# Patient Record
Sex: Female | Born: 1960 | Race: Black or African American | Hispanic: No | Marital: Married | Smoking: Never smoker
Health system: Southern US, Community
[De-identification: ages and names within clinical notes are randomized; demographics above are authoritative.]

## PROBLEM LIST (undated history)

## (undated) DIAGNOSIS — I10 Essential (primary) hypertension: Secondary | ICD-10-CM

## (undated) HISTORY — PX: ROTATOR CUFF REPAIR: SHX139

## (undated) HISTORY — DX: Essential (primary) hypertension: I10

## (undated) HISTORY — PX: APPENDECTOMY: SHX54

---

## 2000-06-12 ENCOUNTER — Other Ambulatory Visit: Admission: RE | Admit: 2000-06-12 | Discharge: 2000-06-12 | Payer: Self-pay | Admitting: Family Medicine

## 2000-06-14 ENCOUNTER — Encounter: Payer: Self-pay | Admitting: Family Medicine

## 2000-06-14 ENCOUNTER — Ambulatory Visit (HOSPITAL_COMMUNITY): Admission: RE | Admit: 2000-06-14 | Discharge: 2000-06-14 | Payer: Self-pay | Admitting: Family Medicine

## 2009-10-01 ENCOUNTER — Encounter: Admission: RE | Admit: 2009-10-01 | Discharge: 2009-10-01 | Payer: Self-pay | Admitting: Obstetrics and Gynecology

## 2009-12-22 ENCOUNTER — Encounter (INDEPENDENT_AMBULATORY_CARE_PROVIDER_SITE_OTHER): Payer: Self-pay

## 2009-12-23 ENCOUNTER — Ambulatory Visit: Payer: Self-pay | Admitting: Gastroenterology

## 2010-08-28 ENCOUNTER — Emergency Department (HOSPITAL_COMMUNITY): Admission: EM | Admit: 2010-08-28 | Discharge: 2010-08-28 | Payer: Self-pay | Admitting: Emergency Medicine

## 2011-01-07 ENCOUNTER — Other Ambulatory Visit: Payer: Self-pay | Admitting: Obstetrics and Gynecology

## 2011-01-09 ENCOUNTER — Encounter: Payer: Self-pay | Admitting: Obstetrics and Gynecology

## 2011-01-18 NOTE — Letter (Signed)
Summary: [facility] Instructions  [facility]  [address] [place], [place] [address]   Phone: [phone]  Fax: [phone]       Kathryn Mullen    1961/11/30    MRN: [id number]        Procedure Day /Date:_1/19/11  Wednesday     Arrival Time:_9:00am      Procedure Time:_10:00am     Location of Procedure:                    _x _  [facility] (4th Floor)                        PREPARATION FOR COLONOSCOPY WITH MOVIPREP   Starting 5 days prior to your procedure _1/14/11  do not eat nuts, seeds, popcorn, corn, beans, peas,  salads, or any raw vegetables.  Do not take any fiber supplements (e.g. Metamucil, Citrucel, and Benefiber).  THE DAY BEFORE YOUR PROCEDURE         DATE: 01/05/10  DAY: Tuesday  1.  Drink clear liquids the entire day-NO SOLID FOOD  2.  Do not drink anything colored red or purple.  Avoid juices with pulp.  No orange juice.  3.  Drink at least 64 oz. (8 glasses) of fluid/clear liquids during the day to prevent dehydration and help the prep work efficiently.  CLEAR LIQUIDS INCLUDE: Water Jello Ice Popsicles Tea (sugar ok, no milk/cream) Powdered fruit flavored drinks Coffee (sugar ok, no milk/cream) Gatorade Juice: apple, white grape, white cranberry  Lemonade Clear bullion, consomm, broth Carbonated beverages (any kind) Strained chicken noodle soup Hard Candy                             4.  In the morning, mix first dose of MoviPrep solution:    Empty 1 Pouch A and 1 Pouch B into the disposable container    Add lukewarm drinking water to the top line of the container. Mix to dissolve    Refrigerate (mixed solution should be used within 24 hrs)  5.  Begin drinking the prep at 5:00 p.m. The MoviPrep container is divided by 4 marks.   Every 15 minutes drink the solution down to the next mark (approximately 8 oz) until the full liter is complete.   6.  Follow completed prep with 16 oz of clear liquid of your choice (Nothing  red or purple).  Continue to drink clear liquids until bedtime.  7.  Before going to bed, mix second dose of MoviPrep solution:    Empty 1 Pouch A and 1 Pouch B into the disposable container    Add lukewarm drinking water to the top line of the container. Mix to dissolve    Refrigerate  THE DAY OF YOUR PROCEDURE      DATE: 01/06/10 DAY: Wednesday  Beginning at 5:00a.m. (5 hours before procedure):         1. Every 15 minutes, drink the solution down to the next mark (approx 8 oz) until the full liter is complete.  2. Follow completed prep with 16 oz. of clear liquid of your choice.    3. You may drink clear liquids until 8:00am (2 HOURS BEFORE PROCEDURE).   MEDICATION INSTRUCTIONS  Unless otherwise instructed, you should take regular prescription medications with a small sip of water   as early as possible the morning of your procedure.  Additional medication instructions: _Do not take fluid pill am of procedure.         OTHER INSTRUCTIONS  You will need a responsible adult at least 52 years of age to accompany you and drive you home.   This person must remain in the waiting room during your procedure.  Wear loose fitting clothing that is easily removed.  Leave jewelry and other valuables at home.  However, you may wish to bring a book to read or  an iPod/MP3 player to listen to music as you wait for your procedure to start.  Remove all body piercing jewelry and leave at home.  Total time from sign-in until discharge is approximately 2-3 hours.  You should go home directly after your procedure and rest.  You can resume normal activities the  day after your procedure.  The day of your procedure you should not:   Drive   Make legal decisions   Operate machinery   Drink alcohol   Return to work  You will receive specific instructions about eating, activities and medications before you leave.    The above instructions have been reviewed and explained to  me by   Ulis Rias RN  December 23, 2009 1:15 PM     I fully understand and can verbalize these instructions _____________________________ Date _________

## 2011-01-18 NOTE — Miscellaneous (Signed)
Summary: Lec previsit  Clinical Lists Changes  Medications: Added new medication of MOVIPREP 100 GM  SOLR (PEG-KCL-NACL-NASULF-NA ASC-C) As per prep instructions. - Signed Rx of MOVIPREP 100 GM  SOLR (PEG-KCL-NACL-NASULF-NA ASC-C) As per prep instructions.;  #1 x 0;  Signed;  Entered by: Ulis Rias RN;  Authorized by: Mardella Layman MD [facility];  Method used: Electronically to [facility]  [address], [place], [place]  [address], Ph: [phone] or [phone], Fax: [phone] Observations: Added new observation of NKA: T (12/23/2009 12:46)    Prescriptions: MOVIPREP 100 GM  SOLR (PEG-KCL-NACL-NASULF-NA ASC-C) As per prep instructions.  #1 x 0   Entered by:   Ulis Rias RN   Authorized by:   Mardella Layman MD [facility]   Signed by:   Ulis Rias RN on 12/23/2009   Method used:   [facility]ronically to        [facility]  [address] (retail)       [address]       [place], [place]  [address]       Ph: [phone] or [phone]       Fax: [phone]   RxID:   [id number]

## 2012-03-06 ENCOUNTER — Encounter: Payer: Self-pay | Admitting: Gastroenterology

## 2012-04-16 ENCOUNTER — Encounter: Payer: Self-pay | Admitting: Gastroenterology

## 2013-01-08 ENCOUNTER — Encounter: Payer: Self-pay | Admitting: Internal Medicine

## 2013-01-22 ENCOUNTER — Ambulatory Visit (AMBULATORY_SURGERY_CENTER): Payer: BC Managed Care – PPO

## 2013-01-22 VITALS — Ht 63.0 in | Wt 164.6 lb

## 2013-01-22 DIAGNOSIS — Z1211 Encounter for screening for malignant neoplasm of colon: Secondary | ICD-10-CM

## 2013-01-22 MED ORDER — MOVIPREP 100 G PO SOLR: 1.0000 | Freq: Once | ORAL | Status: DC

## 2013-01-22 NOTE — Progress Notes (Signed)
Pt came into the office today for her pre-visit prior to her colonoscopy with Dr Juanda Chance on 02/05/13. Pt states she had a colononscopy done 25 years ago in [place] due to rectal bleeding, but does not have the results and does not remember the name of the doctor.

## 2013-02-05 ENCOUNTER — Ambulatory Visit (AMBULATORY_SURGERY_CENTER): Payer: BC Managed Care – PPO | Admitting: Internal Medicine

## 2013-02-05 ENCOUNTER — Encounter: Payer: Self-pay | Admitting: Internal Medicine

## 2013-02-05 VITALS — BP 129/77 | HR 84 | Temp 96.6°F | Resp 16 | Ht 63.0 in | Wt 164.0 lb

## 2013-02-05 DIAGNOSIS — Z1211 Encounter for screening for malignant neoplasm of colon: Secondary | ICD-10-CM

## 2013-02-05 MED ORDER — SODIUM CHLORIDE 0.9 % IV SOLN: 500.0000 mL | INTRAVENOUS | Status: DC

## 2013-02-05 NOTE — Patient Instructions (Signed)
YOU HAD AN ENDOSCOPIC PROCEDURE TODAY AT THE [facility]: Refer to the procedure report that was given to you for any specific questions about what was found during the examination.  If the procedure report does not answer your questions, please call your gastroenterologist to clarify.  If you requested that your care partner not be given the details of your procedure findings, then the procedure report has been included in a sealed envelope for you to review at your convenience later.  YOU SHOULD EXPECT: Some feelings of bloating in the abdomen. Passage of more gas than usual.  Walking can help get rid of the air that was put into your GI tract during the procedure and reduce the bloating. If you had a lower endoscopy (such as a colonoscopy or flexible sigmoidoscopy) you may notice spotting of blood in your stool or on the toilet paper. If you underwent a bowel prep for your procedure, then you may not have a normal bowel movement for a few days.  DIET: Your first meal following the procedure should be a light meal and then it is ok to progress to your normal diet.  A half-sandwich or bowl of soup is an example of a good first meal.  Heavy or fried foods are harder to digest and may make you feel nauseous or bloated.  Likewise meals heavy in dairy and vegetables can cause extra gas to form and this can also increase the bloating.  Drink plenty of fluids but you should avoid alcoholic beverages for 24 hours.  ACTIVITY: Your care partner should take you home directly after the procedure.  You should plan to take it easy, moving slowly for the rest of the day.  You can resume normal activity the day after the procedure however you should NOT DRIVE or use heavy machinery for 24 hours (because of the sedation medicines used during the test).    SYMPTOMS TO REPORT IMMEDIATELY: A gastroenterologist can be reached at any hour.  During normal business hours, 8:30 AM to 5:00 PM Monday through Friday,  call [phone].  After hours and on weekends, please call the GI answering service at [phone] who will take a message and have the physician on call contact you.   Following lower endoscopy (colonoscopy or flexible sigmoidoscopy):  Excessive amounts of blood in the stool  Significant tenderness or worsening of abdominal pains  Swelling of the abdomen that is new, acute  Fever of 100F or higher    FOLLOW UP: If any biopsies were taken you will be contacted by phone or by letter within the next 1-3 weeks.  Call your gastroenterologist if you have not heard about the biopsies in 3 weeks.  Our staff will call the home number listed on your records the next business day following your procedure to check on you and address any questions or concerns that you may have at that time regarding the information given to you following your procedure. This is a courtesy call and so if there is no answer at the home number and we have not heard from you through the emergency physician on call, we will assume that you have returned to your regular daily activities without incident.  SIGNATURES/CONFIDENTIALITY: You and/or your care partner have signed paperwork which will be entered into your electronic medical record.  These signatures attest to the fact that that the information above on your After Visit Summary has been reviewed and is understood.  Full responsibility of the confidentiality   of this discharge information lies with you and/or your care-partner.     

## 2013-02-05 NOTE — Progress Notes (Signed)
Patient did not experience any of the following events: a burn prior to discharge; a fall within the facility; wrong site/side/patient/procedure/implant event; or a hospital transfer or hospital admission upon discharge from the facility. (G8907) Patient did not have preoperative order for IV antibiotic SSI prophylaxis. (G8918)  

## 2013-02-05 NOTE — Op Note (Signed)
[facility] [address] [place], [address]   COLONOSCOPY PROCEDURE REPORT  PATIENT: Kathryn Mullen, Kathryn Mullen  MR#: [id number] BIRTHDATE: 06-19-1961 , 51  yrs. old GENDER: Female ENDOSCOPIST: Hart Carwin, MD REFERRED BY:  Carrington Clamp, M.D. PROCEDURE DATE:  02/05/2013 PROCEDURE:   Colonoscopy, screening ASA CLASS:   Class I INDICATIONS:Average risk patient for colon cancer and prior colonoscopy in [place] 20 years ago. MEDICATIONS: MAC sedation, administered by CRNA and propofol (Diprivan) 200mg  IV  DESCRIPTION OF PROCEDURE:   After the risks and benefits and of the procedure were explained, informed consent was obtained.  A digital rectal exam revealed no abnormalities of the rectum.    The LB [id number]-[id number]  endoscope was introduced through the anus and advanced to the cecum, which was identified by both the appendix and ileocecal valve .  The quality of the prep was excellent, using MoviPrep .  The instrument was then slowly withdrawn as the colon was fully examined.     COLON FINDINGS: A normal appearing cecum, ileocecal valve, and appendiceal orifice were identified.  The ascending, hepatic flexure, transverse, splenic flexure, descending, sigmoid colon and rectum appeared unremarkable.  No polyps or cancers were seen. Retroflexed views revealed no abnormalities.     The scope was then withdrawn from the patient and the procedure completed.  COMPLICATIONS: There were no complications. ENDOSCOPIC IMPRESSION: Normal colon  RECOMMENDATIONS: High fiber diet   REPEAT EXAM: In 10 year(s)  for Colonoscopy.  cc:  _______________________________ eSignedHart Carwin, MD 02/05/2013 11:40 AM

## 2013-02-05 NOTE — Progress Notes (Signed)
Patient did not have preoperative order for IV antibiotic SSI prophylaxis. (G8918)  Patient did not experience any of the following events: a burn prior to discharge; a fall within the facility; wrong site/side/patient/procedure/implant event; or a hospital transfer or hospital admission upon discharge from the facility. (G8907)  

## 2013-02-06 ENCOUNTER — Telehealth: Payer: Self-pay | Admitting: *Deleted

## 2013-02-06 NOTE — Telephone Encounter (Signed)
  Follow up Call-  Call back number 02/05/2013  Post procedure Call Back phone  # [phone]  Permission to leave phone message Yes     Patient questions:  Do you have a fever, pain , or abdominal swelling? no Pain Score  0 *  Have you tolerated food without any problems? yes  Have you been able to return to your normal activities? yes  Do you have any questions about your discharge instructions: Diet   no Medications  no Follow up visit  no  Do you have questions or concerns about your Care? no  Actions: * If pain score is 4 or above: No action needed, pain <4.

## 2020-02-27 ENCOUNTER — Ambulatory Visit: Payer: BC Managed Care – PPO | Attending: Family

## 2020-02-27 DIAGNOSIS — Z23 Encounter for immunization: Secondary | ICD-10-CM

## 2020-02-27 NOTE — Progress Notes (Signed)
   Covid-19 Vaccination Clinic  Name:  Kathryn Mullen    MRN: [id number] DOB: February 18, 1961  02/27/2020  [name] was observed post Covid-19 immunization for 15 minutes without incident. She was provided with Vaccine Information Sheet and instruction to access the V-Safe system.   [name] was instructed to call 911 with any severe reactions post vaccine: Marland Kitchen Difficulty breathing  . Swelling of face and throat  . A fast heartbeat  . A bad rash all over body  . Dizziness and weakness   Immunizations Administered    Name Date Dose VIS Date Route   Moderna COVID-19 Vaccine 02/27/2020 11:07 AM 0.5 mL 11/19/2019 Intramuscular   Manufacturer: Moderna   Lot: [id number]   NDC: 92493-241-99

## 2020-03-25 ENCOUNTER — Other Ambulatory Visit: Payer: Self-pay | Admitting: Obstetrics and Gynecology

## 2020-03-25 DIAGNOSIS — N644 Mastodynia: Secondary | ICD-10-CM

## 2020-03-31 ENCOUNTER — Ambulatory Visit: Payer: BC Managed Care – PPO | Attending: Family

## 2020-03-31 ENCOUNTER — Other Ambulatory Visit: Payer: Self-pay

## 2020-03-31 DIAGNOSIS — Z23 Encounter for immunization: Secondary | ICD-10-CM

## 2020-03-31 NOTE — Progress Notes (Signed)
   Covid-19 Vaccination Clinic  Name:  Kathryn Mullen    MRN: [id number] DOB: January 06, 1961  03/31/2020  [name] was observed post Covid-19 immunization for 30 minutes based on pre-vaccination screening without incident. She was provided with Vaccine Information Sheet and instruction to access the V-Safe system.   [name] was instructed to call 911 with any severe reactions post vaccine: Marland Kitchen Difficulty breathing  . Swelling of face and throat  . A fast heartbeat  . A bad rash all over body  . Dizziness and weakness   Immunizations Administered    Name Date Dose VIS Date Route   Moderna COVID-19 Vaccine 03/31/2020 11:47 AM 0.5 mL 11/19/2019 Intramuscular   Manufacturer: Moderna   Lot: [id number]   NDC: 10034-961-16

## 2020-04-08 ENCOUNTER — Ambulatory Visit
Admission: RE | Admit: 2020-04-08 | Discharge: 2020-04-08 | Disposition: A | Discharge status: Home / Self Care | Payer: BC Managed Care – PPO | Source: Ambulatory Visit | Attending: Obstetrics and Gynecology | Admitting: Obstetrics and Gynecology

## 2020-04-08 ENCOUNTER — Other Ambulatory Visit: Payer: Self-pay

## 2020-04-08 DIAGNOSIS — N644 Mastodynia: Secondary | ICD-10-CM

## 2020-05-27 IMAGING — MG MM DIGITAL DIAGNOSTIC UNILAT*L* W/ TOMO W/ CAD
6 series · 6 of 18 positions shown · non-contrast
Comparison: Previous exam(s).

CLINICAL DATA: 58-year-old female presenting with focal pain in the
inferior left breast.

EXAM:
DIGITAL DIAGNOSTIC LEFT MAMMOGRAM WITH TOMO
ULTRASOUND LEFT BREAST

[L CC synth-2D]
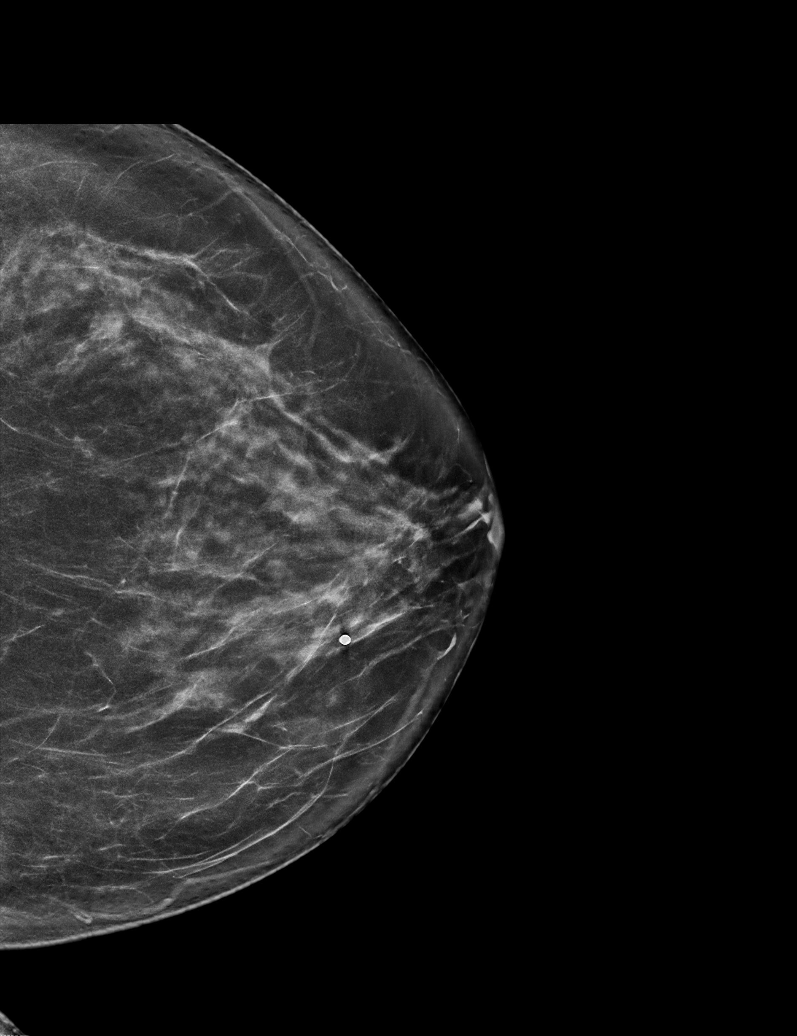

[L MLO synth-2D (1 of 2)]
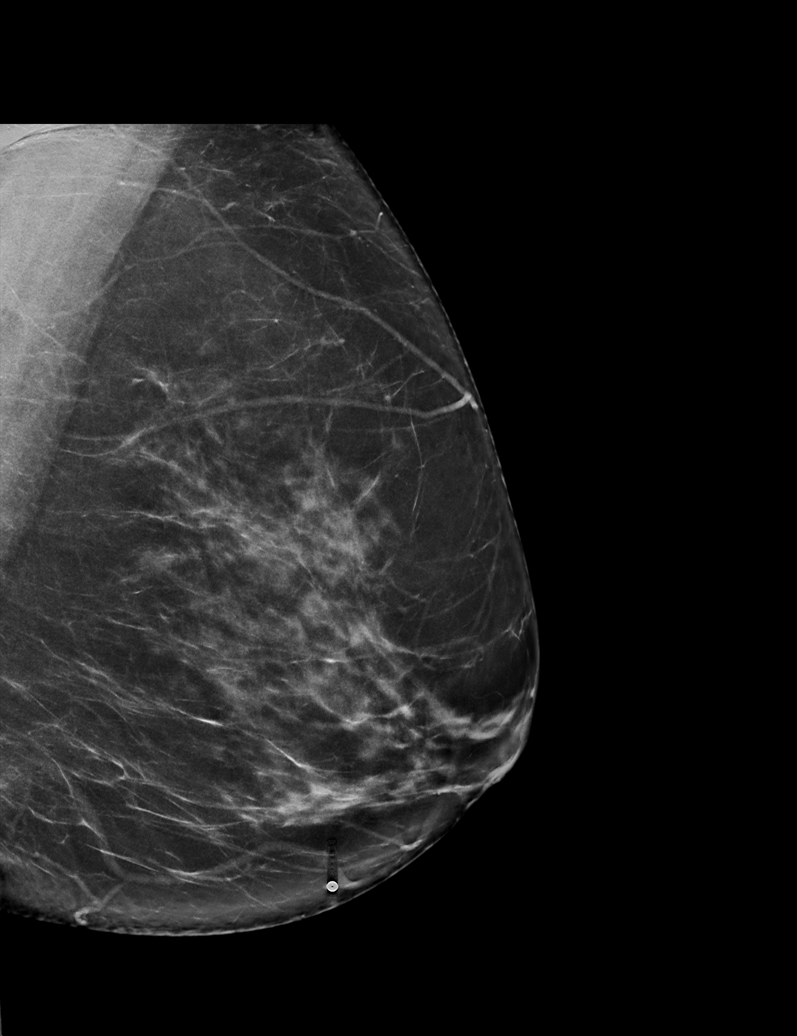

[L MLO synth-2D (2 of 2)]
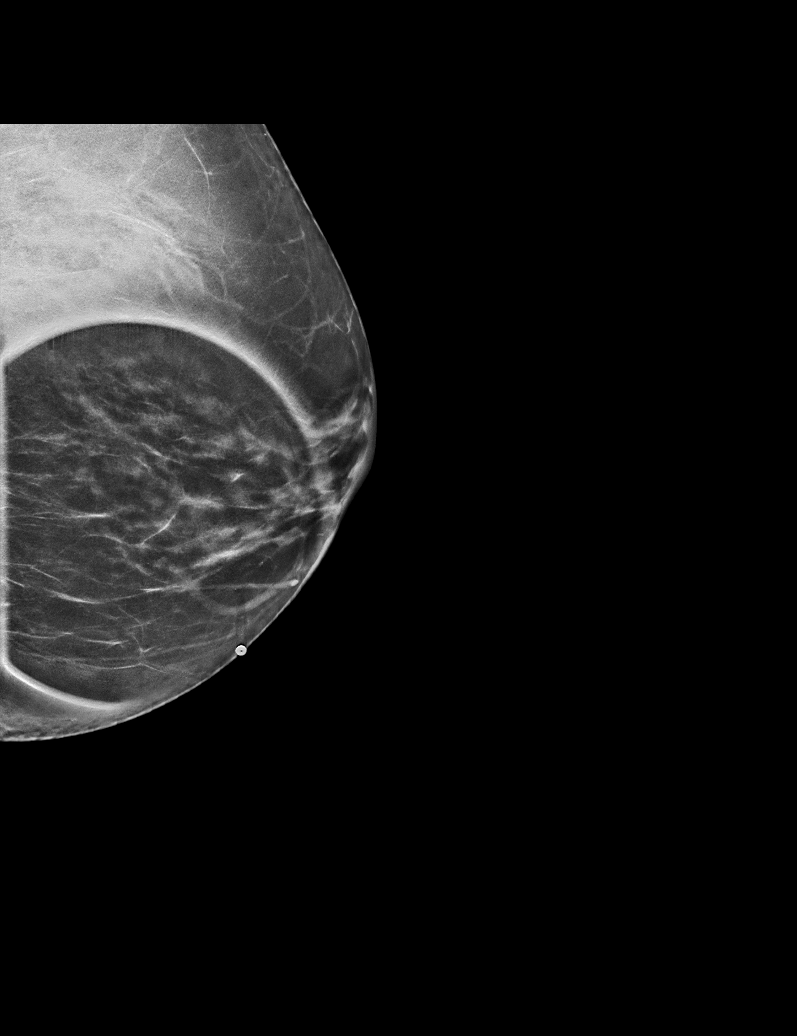

[L MLO tomo (1 of 2) · tomo slice 29/57.0]
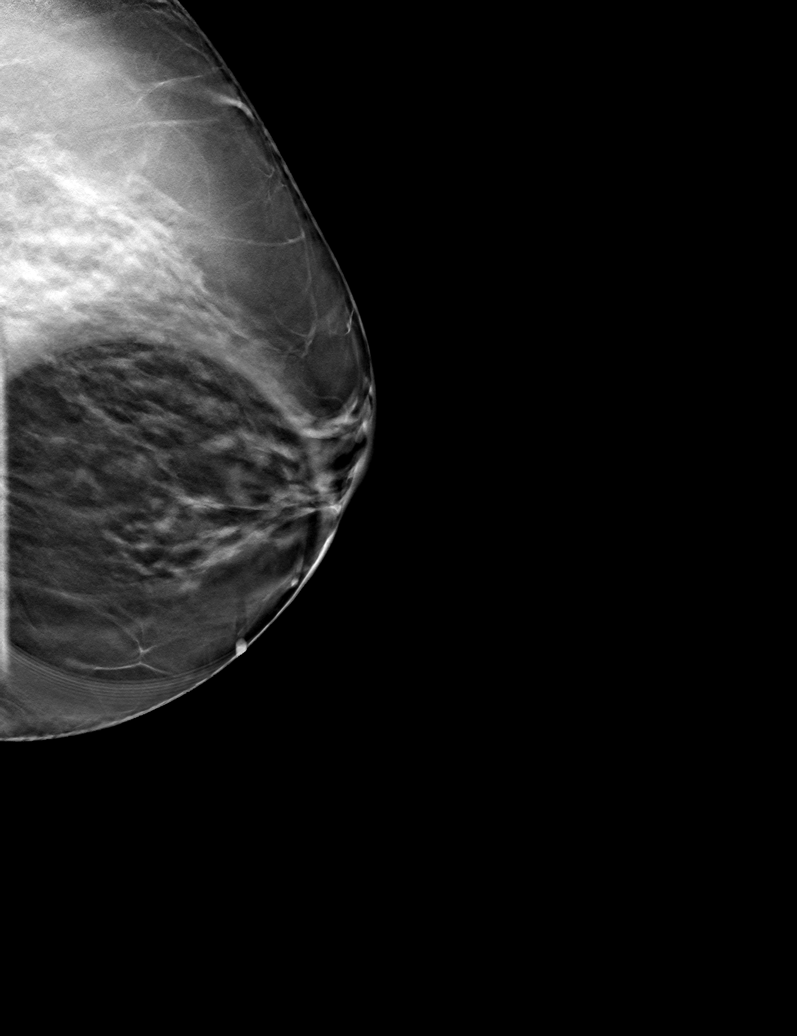

[L CC tomo · tomo slice 35/70.0]
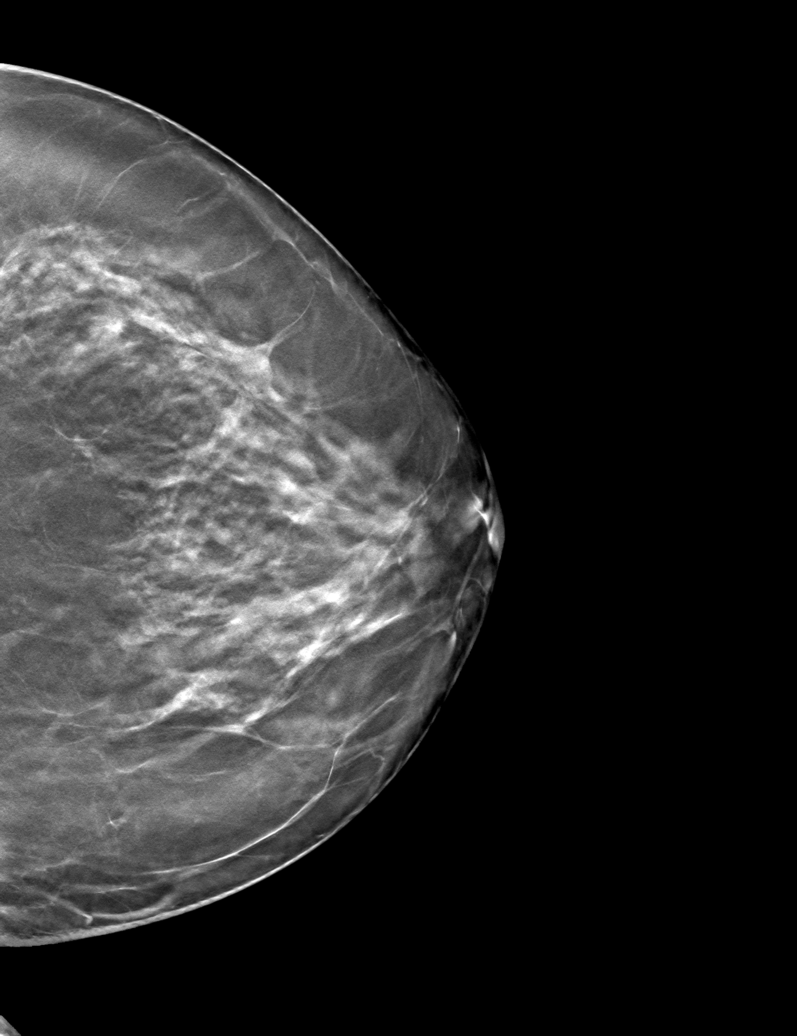

[L MLO tomo (2 of 2) · tomo slice 39/76.0]
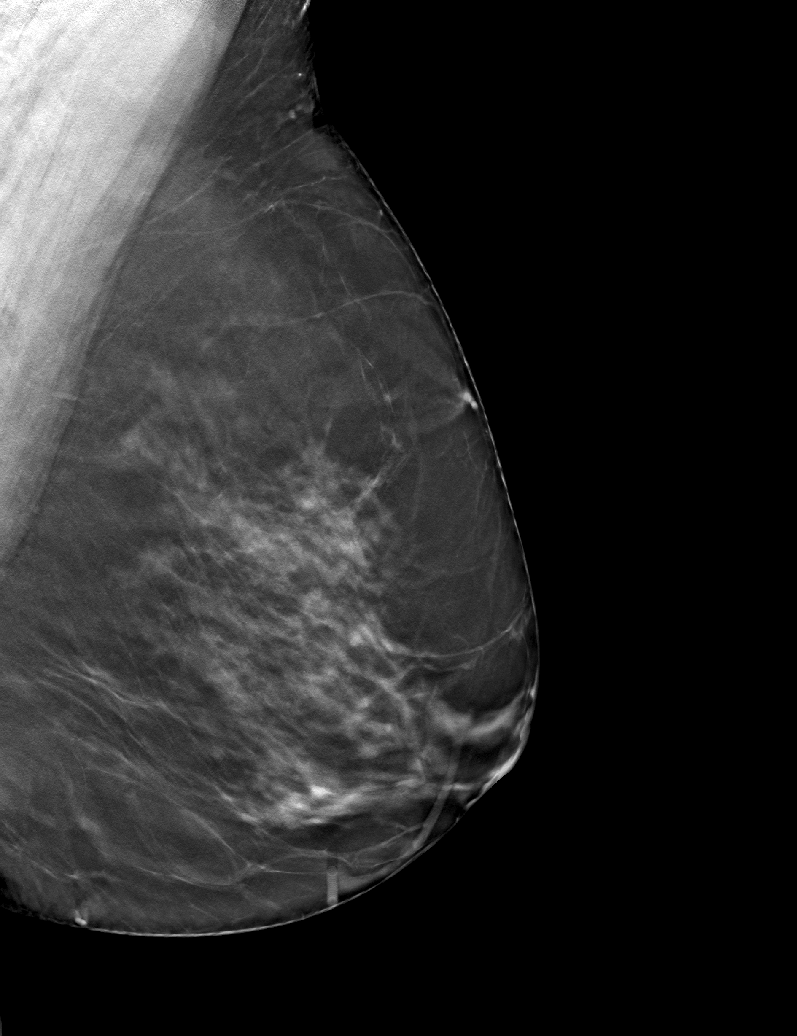

[6 of 18 positions shown; findings below may reference images not displayed]

ACR Breast Density Category c: The breast tissue is heterogeneously
dense, which may obscure small masses.
FINDINGS: Mammogram:

Full field and spot compression tomosynthesis views of the left
breast were performed. There is no suspicious mass, distortion or
microcalcification identified at the patient's site of pain in the
inferior left breast.

Physical exam: At the site of pain in the slightly inferior left
breast I do not palpate a fixed discrete mass or other abnormality.
The patient reports tenderness with very deep palpation along the
chest wall.

Ultrasound:

Targeted ultrasound is performed in the left breast at 6 o'clock 2
cm from the nipple demonstrating no discrete cystic or solid mass or
other finding to explain the patient's focal pain.
IMPRESSION: No mammographic or sonographic evidence of malignancy at the site of
pain in the left breast.

RECOMMENDATION:
1. Return to screening mammography which will be due in August 2020.

2. Clinical follow-up as needed for the left breast pain. We
discussed some of the issues regarding breast pain, including
limiting caffeine, wearing adequate support, over-the-counter pain
medication, low-fat diet, exercise, and ice as needed.

I have discussed the findings and recommendations with the patient.
If applicable, a reminder letter will be sent to the patient
regarding the next appointment.

BI-RADS CATEGORY  1: Negative.

## 2020-11-18 ENCOUNTER — Ambulatory Visit: Payer: BC Managed Care – PPO | Attending: Family

## 2020-11-18 DIAGNOSIS — Z23 Encounter for immunization: Secondary | ICD-10-CM

## 2021-03-05 NOTE — Progress Notes (Signed)
   Covid-19 Vaccination Clinic  Name:  Kathryn Mullen    MRN: [id number] DOB: 11/16/1961  03/05/2021  [name] was observed post Covid-19 immunization for 15 minutes without incident. She was provided with Vaccine Information Sheet and instruction to access the V-Safe system.   [name] was instructed to call 911 with any severe reactions post vaccine: Marland Kitchen Difficulty breathing  . Swelling of face and throat  . A fast heartbeat  . A bad rash all over body  . Dizziness and weakness   Immunizations Administered    Name Date Dose VIS Date Route   Moderna Covid-19 Booster Vaccine 11/18/2020  2:00 PM 0.25 mL 10/07/2020 Intramuscular   Manufacturer: Moderna   Lot: [id number]   NDC: 34356-861-68

## 2021-11-23 ENCOUNTER — Ambulatory Visit: Payer: Self-pay | Attending: Family

## 2021-11-23 ENCOUNTER — Other Ambulatory Visit: Payer: Self-pay

## 2021-11-23 DIAGNOSIS — Z23 Encounter for immunization: Secondary | ICD-10-CM

## 2021-11-23 NOTE — Progress Notes (Signed)
   Covid-19 Vaccination Clinic  Name:  Kathryn Mullen    MRN: [id number] DOB: [birth year]  11/23/2021  [name] was observed post Covid-19 immunization for 15 minutes without incident. She was provided with Vaccine Information Sheet and instruction to access the V-Safe system.   [name] was instructed to call 911 with any severe reactions post vaccine: Difficulty breathing  Swelling of face and throat  A fast heartbeat  A bad rash all over body  Dizziness and weakness   Immunizations Administered     Name Date Dose VIS Date Route   Moderna Covid-19 vaccine Bivalent Booster 11/23/2021  2:58 PM 0.5 mL 07/31/2021 Intramuscular   Manufacturer: Gala Murdoch   Lot: [id number]   NDC: 59470-761-51

## 2022-09-26 DIAGNOSIS — Z79899 Other long term (current) drug therapy: Secondary | ICD-10-CM | POA: Insufficient documentation

## 2022-09-26 DIAGNOSIS — I10 Essential (primary) hypertension: Secondary | ICD-10-CM | POA: Diagnosis not present

## 2022-09-26 DIAGNOSIS — R519 Headache, unspecified: Secondary | ICD-10-CM | POA: Diagnosis present

## 2022-09-27 ENCOUNTER — Emergency Department
Admission: EM | Admit: 2022-09-27 | Discharge: 2022-09-27 | Disposition: A | Discharge status: Home / Self Care | Payer: BC Managed Care – PPO | Attending: Emergency Medicine | Admitting: Emergency Medicine

## 2022-09-27 ENCOUNTER — Emergency Department: Payer: BC Managed Care – PPO

## 2022-09-27 ENCOUNTER — Other Ambulatory Visit: Payer: Self-pay

## 2022-09-27 DIAGNOSIS — I10 Essential (primary) hypertension: Secondary | ICD-10-CM

## 2022-09-27 LAB — CBC
HCT: 39.6 % (ref 36.0–46.0)
Hemoglobin: 13.5 g/dL (ref 12.0–15.0)
MCH: 30.5 pg (ref 26.0–34.0)
MCHC: 34.1 g/dL (ref 30.0–36.0)
MCV: 89.6 fL (ref 80.0–100.0)
Platelets: 217 10*3/uL (ref 150–400)
RBC: 4.42 MIL/uL (ref 3.87–5.11)
RDW: 12.4 % (ref 11.5–15.5)
WBC: 6.4 10*3/uL (ref 4.0–10.5)
nRBC: 0 % (ref 0.0–0.2)

## 2022-09-27 LAB — COMPREHENSIVE METABOLIC PANEL
ALT: 17 U/L (ref 0–44)
AST: 26 U/L (ref 15–41)
Albumin: 4.3 g/dL (ref 3.5–5.0)
Alkaline Phosphatase: 52 U/L (ref 38–126)
Anion gap: 11 (ref 5–15)
BUN: 14 mg/dL (ref 8–23)
CO2: 28 mmol/L (ref 22–32)
Calcium: 9.5 mg/dL (ref 8.9–10.3)
Chloride: 94 mmol/L — ABNORMAL LOW (ref 98–111)
Creatinine, Ser: 0.86 mg/dL (ref 0.44–1.00)
GFR, Estimated: >60 mL/min (ref >60)
Glucose, Bld: 94 mg/dL (ref 70–99)
Potassium: 3 mmol/L — ABNORMAL LOW (ref 3.5–5.1)
Sodium: 133 mmol/L — ABNORMAL LOW (ref 135–145)
Total Bilirubin: 1.1 mg/dL (ref 0.3–1.2)
Total Protein: 7.7 g/dL (ref 6.5–8.1)

## 2022-09-27 LAB — URINALYSIS, ROUTINE W REFLEX MICROSCOPIC
Bilirubin Urine: NEGATIVE
Glucose, UA: NEGATIVE mg/dL
Hgb urine dipstick: NEGATIVE
Ketones, ur: NEGATIVE mg/dL
Leukocytes,Ua: NEGATIVE
Nitrite: NEGATIVE
Protein, ur: NEGATIVE mg/dL
Specific Gravity, Urine: 1.001 — ABNORMAL LOW (ref 1.005–1.030)
pH: 6 (ref 5.0–8.0)

## 2022-09-27 LAB — TROPONIN I (HIGH SENSITIVITY): Troponin I (High Sensitivity): 12 ng/L (ref <18)

## 2022-09-27 MED ORDER — ACETAMINOPHEN 500 MG PO TABS
1000.0000 mg | ORAL_TABLET | Freq: Once | ORAL | Status: AC
  Administered 2022-09-27: 1000 mg via ORAL
  Filled 2022-09-27: qty 2

## 2022-09-27 NOTE — ED Notes (Signed)
Lt green and lavender top sent to lab 

## 2022-09-27 NOTE — Discharge Instructions (Addendum)
Please continue your blood pressure medications as prescribed and follow-up closely with your primary care doctor.   You may alternate Tylenol 1000 mg every 6 hours as needed for pain, fever and Ibuprofen 800 mg every 6-8 hours as needed for pain, fever.  Please take Ibuprofen with food.  Do not take more than 4000 mg of Tylenol (acetaminophen) in a 24 hour period.   Your labs, urine, EKG, chest x-ray, head CT today were reassuring.

## 2022-09-27 NOTE — ED Triage Notes (Signed)
Pt presents to ED via POV with c/o high blood pressure. Pts states blood pressure has been high today and yesterday. Pt states she takes blood pressure medicine but it did not work. Pt also states that shes recently eaten a really salty food and thinks this is the reason for her high blood pressure. Denies CP, SOB

## 2022-09-27 NOTE — ED Provider Notes (Signed)
[facility] Provider Note    Event Date/Time   First MD Initiated Contact with Patient 09/27/22 [phone]     (approximate)   History   Hypertension   HPI  Kathryn Mullen is a 61 y.o. female history of hypertension on HCTZ 25 mg daily who presents to the emergency department with concerns for elevated blood pressure and generalized headache.  States she never gets a headache and initially thought it was due to sinus pressure from the change in the weather.  Checked her blood pressure and it was elevated at home.  Normally in the 130s systolic.  No recent change in her blood pressure medications and no missed doses.  No chest pain, shortness of breath, vision changes, numbness, tingling or weakness.  States she has had food that was more salty than what she normally eats over the past several days which she thinks may be contributing to her hypertension.   History provided by patient.    Past Medical History:  Diagnosis Date   Hypertension     Past Surgical History:  Procedure Laterality Date   APPENDECTOMY     ROTATOR CUFF REPAIR     left shoulder    MEDICATIONS:  Prior to Admission medications   Medication Sig Start Date End Date Taking? Authorizing Provider  hydrochlorothiazide (HYDRODIURIL) 25 MG tablet Take 25 mg by mouth daily.    [provider]    Physical Exam   Triage Vital Signs: ED Triage Vitals  Enc Vitals Group     BP 09/27/22 0017 (!) 185/96     Pulse Rate 09/27/22 0017 71     Resp 09/27/22 0017 17     Temp 09/27/22 0017 97.7 F (36.5 C)     Temp Source 09/27/22 0017 Oral     SpO2 09/27/22 0017 100 %     Weight 09/27/22 0024 168 lb (76.2 kg)     Height 09/27/22 0024 5\' 3"  (1.6 m)     Head Circumference --      Peak Flow --      Pain Score 09/27/22 0023 3     Pain Loc --      Pain Edu? --      Excl. in GC? --     Most recent vital signs: Vitals:   09/27/22 0017 09/27/22 0610  BP: (!) 185/96 (!) 152/63   Pulse: 71 70  Resp: 17 18  Temp: 97.7 F (36.5 C)   SpO2: 100% 99%    CONSTITUTIONAL: Alert and oriented and responds appropriately to questions. Well-appearing; well-nourished HEAD: Normocephalic, atraumatic EYES: Conjunctivae clear, pupils appear equal, sclera nonicteric ENT: normal nose; moist mucous membranes NECK: Supple, normal ROM CARD: RRR; S1 and S2 appreciated; no murmurs, no clicks, no rubs, no gallops RESP: Normal chest excursion without splinting or tachypnea; breath sounds clear and equal bilaterally; no wheezes, no rhonchi, no rales, no hypoxia or respiratory distress, speaking full sentences ABD/GI: Normal bowel sounds; non-distended; soft, non-tender, no rebound, no guarding, no peritoneal signs BACK: The back appears normal EXT: Normal ROM in all joints; no deformity noted, no edema; no cyanosis SKIN: Normal color for age and race; warm; no rash on exposed skin NEURO: Moves all extremities equally, normal speech, normal gait, normal sensation diffusely, no facial asymmetry PSYCH: The patient's mood and manner are appropriate.   ED Results / Procedures / Treatments   LABS: (all labs ordered are listed, but only abnormal results are displayed) Labs Reviewed  COMPREHENSIVE  METABOLIC PANEL - Abnormal; Notable for the following components:      Result Value   Sodium 133 (*)    Potassium 3.0 (*)    Chloride 94 (*)    All other components within normal limits  URINALYSIS, ROUTINE W REFLEX MICROSCOPIC - Abnormal; Notable for the following components:   Color, Urine COLORLESS (*)    APPearance CLEAR (*)    Specific Gravity, Urine 1.001 (*)    All other components within normal limits  CBC  TROPONIN I (HIGH SENSITIVITY)     EKG:  EKG Interpretation  Date/Time:  Tuesday September 27 2022 03:16:57 EDT Ventricular Rate:  64 PR Interval:  118 QRS Duration: 82 QT Interval:  422 QTC Calculation: 435 R Axis:   18 Text Interpretation: Normal sinus rhythm  Nonspecific T wave abnormality Abnormal ECG No previous ECGs available Confirmed by Pryor Curia [phone]) on 09/27/2022 6:14:11 AM         RADIOLOGY: My personal review and interpretation of imaging: Chest x-ray clear.  Head CT shows no acute abnormality.  I have personally reviewed all radiology reports.   CT HEAD WO CONTRAST (5MM)  Result Date: 09/27/2022 CLINICAL DATA:  61 year old female with headache and hypertension. EXAM: CT HEAD WITHOUT CONTRAST TECHNIQUE: Contiguous axial images were obtained from the base of the skull through the vertex without intravenous contrast. RADIATION DOSE REDUCTION: This exam was performed according to the departmental dose-optimization program which includes automated exposure control, adjustment of the mA and/or kV according to patient size and/or use of iterative reconstruction technique. COMPARISON:  None Available. FINDINGS: Brain: Faint vascular calcifications at the bilateral basal ganglia. Cerebral volume is within normal limits for age. No midline shift, ventriculomegaly, mass effect, evidence of mass lesion, intracranial hemorrhage or evidence of cortically based acute infarction. Gray-white matter differentiation is within normal limits throughout the brain. Vascular: Mild Calcified atherosclerosis at the skull base. No suspicious intracranial vascular hyperdensity. 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 No acute osseous abnormality identified. Sinuses/Orbits: Visualized paranasal sinuses and mastoids are clear. Other: Postoperative changes to both globes. Visualized scalp soft tissues are within normal limits. IMPRESSION: Normal for age non contrast Head CT. Electronically Signed   By: Genevie Ann M.D.   On: 09/27/2022 07:10   DG Chest 2 View  Result Date: 09/27/2022 CLINICAL DATA:  Hypertension EXAM: CHEST - 2 VIEW COMPARISON:  None Available. FINDINGS: The heart size and mediastinal contours are within normal  limits. Both lungs are clear. The visualized skeletal structures are unremarkable. IMPRESSION: No active cardiopulmonary disease. Electronically Signed   By: Lucienne Capers M.D.   On: 09/27/2022 03:52     PROCEDURES:  Critical Care performed: No    Procedures    IMPRESSION / MDM / ASSESSMENT AND PLAN / ED COURSE  I reviewed the triage vital signs and the nursing notes.    Patient here with complaints of headache and hypertension.  The patient is on the cardiac monitor to evaluate for evidence of arrhythmia and/or significant heart rate changes.   DIFFERENTIAL DIAGNOSIS (includes but not limited to):   Hypertensive emergency, hypertensive urgency, intracranial hemorrhage, less likely CVA, ACS, dissection, PE   Patient's presentation is most consistent with acute presentation with potential threat to life or bodily function.   PLAN: CBC, BMP, troponin, EKG and chest x-ray obtained from triage.  No leukocytosis.  Normal hemoglobin, electrolytes and renal function.  Chest x-ray reviewed and interpreted by myself and the radiologist and shows  no acute abnormality, no widened mediastinum or CHF.  EKG shows nonspecific T wave changes with no old for comparison.  She is not having any chest pain or shortness of breath and troponin is negative today.  I do not feel she needs serial troponins.  She is complaining of headache and states she never has a headache although denies that it is severe in nature and has no focal neurologic deficits.  Discussed risk and benefits of proceeding with CT imaging.  She would like CT head today.  Will give Tylenol.  Blood pressure currently 152/83 on my evaluation.   MEDICATIONS GIVEN IN ED: Medications  acetaminophen (TYLENOL) tablet 1,000 mg (1,000 mg Oral Given 09/27/22 9643)     ED COURSE: Patient's head CT reviewed and interpreted by myself and the radiologist and shows no acute abnormality.  Patient reports feeling better.  Have recommended diet  changes, continued blood pressure medication regimen and close outpatient follow-up.  Discussed return precautions with patient.  She is comfortable with this plan.   At this time, I do not feel there is any life-threatening condition present. I reviewed all nursing notes, vitals, pertinent previous records.  All lab and urine results, EKGs, imaging ordered have been independently reviewed and interpreted by myself.  I reviewed all available radiology reports from any imaging ordered this visit.  Based on my assessment, I feel the patient is safe to be discharged home without further emergent workup and can continue workup as an outpatient as needed. Discussed all findings, treatment plan as well as usual and customary return precautions.  They verbalize understanding and are comfortable with this plan.  Outpatient follow-up has been provided as needed.  All questions have been answered.    CONSULTS: Admission considered but patient is feeling better and blood pressure is improving.  She is neurologically intact without chest pain or shortness of breath.  No obvious signs of hypertensive urgency, emergency.  Will discharge home.   OUTSIDE RECORDS REVIEWED: Reviewed patient's last ophthalmology visit on 11/30/2021.       FINAL CLINICAL IMPRESSION(S) / ED DIAGNOSES   Final diagnoses:  Hypertension, unspecified type     Rx / DC Orders   ED Discharge Orders     None        Note:  This document was prepared using Dragon voice recognition software and may include unintentional dictation errors.   Kiegan Macaraeg, Layla Maw, DO 09/27/22 [phone]

## 2023-11-01 ENCOUNTER — Encounter: Payer: Self-pay | Admitting: Internal Medicine

## 2024-06-06 ENCOUNTER — Other Ambulatory Visit: Payer: Self-pay | Admitting: Family Medicine

## 2024-06-06 DIAGNOSIS — R918 Other nonspecific abnormal finding of lung field: Secondary | ICD-10-CM

## 2024-06-07 ENCOUNTER — Ambulatory Visit
Admission: RE | Admit: 2024-06-07 | Discharge: 2024-06-07 | Disposition: A | Discharge status: Home / Self Care | Payer: Self-pay | Source: Ambulatory Visit | Attending: Family Medicine | Admitting: Family Medicine

## 2024-06-07 ENCOUNTER — Other Ambulatory Visit: Payer: Self-pay

## 2024-06-07 DIAGNOSIS — R918 Other nonspecific abnormal finding of lung field: Secondary | ICD-10-CM

## 2024-06-07 MED ORDER — IOPAMIDOL (ISOVUE-300) INJECTION 61%
75.0000 mL | Freq: Once | INTRAVENOUS | Status: AC | PRN
  Administered 2024-06-07: 75 mL via INTRAVENOUS

## 2024-07-08 ENCOUNTER — Telehealth: Payer: Self-pay | Admitting: Pulmonary Disease

## 2024-07-08 NOTE — Telephone Encounter (Signed)
 LVMTCB to schedule pulmonary consult.

## 2024-07-23 ENCOUNTER — Encounter: Payer: Self-pay | Admitting: Pulmonary Disease

## 2024-07-23 ENCOUNTER — Ambulatory Visit: Admitting: Pulmonary Disease

## 2024-07-23 VITALS — BP 136/84 | HR 93 | Ht 63.0 in | Wt 177.2 lb

## 2024-07-23 DIAGNOSIS — J189 Pneumonia, unspecified organism: Secondary | ICD-10-CM

## 2024-07-23 DIAGNOSIS — Z8616 Personal history of COVID-19: Secondary | ICD-10-CM | POA: Diagnosis not present

## 2024-07-23 NOTE — Patient Instructions (Signed)
 VISIT SUMMARY:  Today, we discussed the ground glass opacities found in your left lung after your prolonged recovery from COVID-19. We reviewed your symptoms and the results of your previous chest x-ray and CT scan.  YOUR PLAN:  -POST-COVID PNEUMONIA WITH GROUND GLASS OPACITIES, LEFT LUNG: Ground glass opacities are hazy areas seen on a CT scan that can indicate inflammation or infection in the lungs. In your case, these are consistent with pneumonia following your COVID-19 infection. We will order a repeat chest CT to ensure these opacities have resolved. Please follow up with us  to review the CT results and schedule a follow-up appointment in 4-6 weeks.  -POST-VIRAL COUGH SYNDROME (RESOLVED): Post-viral cough syndrome is a persistent cough that can occur after a viral infection, such as COVID-19. Your cough has resolved, indicating that the inflammation has decreased.  INSTRUCTIONS:  Please schedule a repeat chest CT and a follow-up appointment in 4-6 weeks to review the results.

## 2024-07-23 NOTE — Progress Notes (Signed)
 Subjective:    Patient ID: Kathryn Mullen, female    DOB: 05-Aug-1961, 63 y.o.   MRN: [id number]  Patient Care Team: Cristopher Bottcher, NP as PCP - General (Family Medicine)  Chief Complaint  Patient presents with   Consult    BACKGROUND: Kathryn Mullen is a 63 year old lifelong never smoker who presents for evaluation of abnormal CT scan of the chest.  She is kindly referred referred by Bottcher Neri, NP   HPI Discussed the use of AI scribe software for clinical note transcription with the patient, who gave verbal consent to proceed.  History of Present Illness   Kathryn Mullen is a 63 year old female who presents for evaluation of ground glass opacities on the left lung detected in June.  In June, she experienced a prolonged recovery from COVID-19, characterized by persistent cough, fatigue, sneezing, and nasal congestion. She self-tested positive for COVID-19 during that time. The cough was particularly difficult to resolve, and she also experienced shortness of breath.  Following the persistent cough, she consulted with her primary care practitioner, who recommended a chest x-ray. The x-ray revealed a nodule, initially suspected to be the beginning of pneumonia. A subsequent CT scan was performed after the chest x-ray.  She has a history of sinus allergies and had asthma as a child, which she has since outgrown. She is a lifelong non-smoker.  Her previously noted cough and shortness of breath have completely resolved.  Currently asymptomatic.  Overall she feels well and looks well.    Review of Systems A 10 point review of systems was performed and it is as noted above otherwise negative.   Past Medical History:  Diagnosis Date   Hypertension     Past Surgical History:  Procedure Laterality Date   APPENDECTOMY     ROTATOR CUFF REPAIR     left shoulder    There are no active problems to display for this patient.   Family History  Problem Relation Age of Onset    Diabetes Mother    Hypertension Mother    Breast cancer Sister 14    Social History   Tobacco Use   Smoking status: Never   Smokeless tobacco: Never  Substance Use Topics   Alcohol use: No    No Known Allergies  Current Meds  Medication Sig   Cholecalciferol (VITAMIN D3 PO) Take 1 tablet by mouth daily in the afternoon.   Cyanocobalamin (VITAMIN B-12 PO) Take 1 tablet by mouth daily in the afternoon.   hydrochlorothiazide (HYDRODIURIL) 25 MG tablet Take 25 mg by mouth daily.    Immunization History  Administered Date(s) Administered   Moderna Covid-19 Vaccine Bivalent Booster 30yrs & up 11/23/2021   Moderna SARS-COV2 Booster Vaccination 11/18/2020   Moderna Sars-Covid-2 Vaccination 02/27/2020, 03/31/2020        Objective:     BP 136/84 (BP Location: Left Arm, Patient Position: Sitting, Cuff Size: Normal)   Pulse 93   Ht 5' 3 (1.6 m)   Wt 177 lb 3.2 oz (80.4 kg)   SpO2 100%   BMI 31.39 kg/m   SpO2: 100 %  GENERAL: Well-developed, well-nourished woman, no acute distress.  No conversational dyspnea.  Fully ambulatory. HEAD: Normocephalic, atraumatic.  EYES: Pupils equal, round, reactive to light.  No scleral icterus.  MOUTH: Dentition intact, no thrush.  Pharynx clear. NECK: Supple. No thyromegaly. Trachea midline. No JVD.  No adenopathy. PULMONARY: Good air entry bilaterally.  No adventitious sounds. CARDIOVASCULAR: S1 and S2. Regular  rate and rhythm.  No rubs, murmurs or gallops heard. ABDOMEN: Benign. MUSCULOSKELETAL: No joint deformity, no clubbing, no edema.  NEUROLOGIC: No overt focal deficit, no gait disturbance, speech is fluent. SKIN: Intact,warm,dry. PSYCH: Mood and behavior normal  Representative images from CT performed 07 June 2024 showing the groundglass coalescent opacities on the left lung:    Assessment & Plan:     ICD-10-CM   1. Multifocal pneumonia  J18.9 CT CHEST WO CONTRAST    2. History of COVID-19  Z86.16       Orders Placed  This Encounter  Procedures   CT CHEST WO CONTRAST    Standing Status:   Future    Expected Date:   08/06/2024    Expiration Date:   07/23/2025    Preferred imaging location?:   [facility]   Discussion:    Post-COVID pneumonia with ground glass opacities, left lung Ground glass opacities in the left lung on CT scan, consistent with post-COVID pneumonia. Symptoms initially included persistent cough and shortness of breath following a COVID-19 infection in June. The opacities are more pronounced on the left side. Differential diagnosis favors infectious etiology over malignancy, as the opacities are typical for COVID pneumonia. The cough has resolved, indicating reduced inflammation. - Order repeat chest CT to ensure resolution of ground glass opacities - Review CT results and follow up in 4-6 weeks to assess progress  Post-viral cough syndrome (resolved) Cough following COVID-19 infection has resolved, indicating resolution of post-viral inflammation.      Advised if symptoms do not improve or worsen, to please contact office for sooner follow up or seek emergency care.    I spent 45 minutes of dedicated to the care of this patient on the date of this encounter to include pre-visit review of records, face-to-face time with the patient discussing conditions above, post visit ordering of testing, clinical documentation with the electronic health record, making appropriate referrals as documented, and communicating necessary findings to members of the patients care team.   C. Leita Sanders, MD Advanced Bronchoscopy [facility]    *This note was dictated using voice recognition software/Dragon.  Despite best efforts to proofread, errors can occur which can change the meaning. Any transcriptional errors that result from this process are unintentional and may not be fully corrected at the time of dictation.

## 2024-08-22 ENCOUNTER — Encounter: Payer: Self-pay | Admitting: Pulmonary Disease

## 2024-08-28 ENCOUNTER — Ambulatory Visit: Admitting: Pulmonary Disease

## 2024-08-28 ENCOUNTER — Other Ambulatory Visit

## 2024-09-06 ENCOUNTER — Ambulatory Visit: Admitting: Pulmonary Disease
